# Patient Record
Sex: Female | Born: 1990 | Race: Black or African American | Hispanic: No | Marital: Single | Smoking: Current every day smoker
Health system: Southern US, Community
[De-identification: ages and names within clinical notes are randomized; demographics above are authoritative.]

## PROBLEM LIST (undated history)

## (undated) DIAGNOSIS — J45909 Unspecified asthma, uncomplicated: Secondary | ICD-10-CM

## (undated) HISTORY — PX: CHOLECYSTECTOMY: SHX55

## (undated) HISTORY — DX: Unspecified asthma, uncomplicated: J45.909

---
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
## 2017-03-14 HISTORY — PX: WISDOM TOOTH EXTRACTION: SHX21

## 2017-03-14 HISTORY — PX: CHOLECYSTECTOMY: SHX55

## 2017-10-30 ENCOUNTER — Other Ambulatory Visit (INDEPENDENT_AMBULATORY_CARE_PROVIDER_SITE_OTHER): Payer: Self-pay | Admitting: *Deleted

## 2017-10-30 DIAGNOSIS — K8050 Calculus of bile duct without cholangitis or cholecystitis without obstruction: Secondary | ICD-10-CM | POA: Insufficient documentation

## 2017-10-31 ENCOUNTER — Encounter (HOSPITAL_COMMUNITY)
Admission: RE | Admit: 2017-10-31 | Discharge: 2017-10-31 | Disposition: A | Discharge status: Home / Self Care | Payer: Self-pay | Source: Ambulatory Visit | Attending: Internal Medicine | Admitting: Internal Medicine

## 2017-10-31 ENCOUNTER — Other Ambulatory Visit: Payer: Self-pay

## 2017-10-31 ENCOUNTER — Encounter (HOSPITAL_COMMUNITY): Payer: Self-pay

## 2017-10-31 DIAGNOSIS — Z01818 Encounter for other preprocedural examination: Secondary | ICD-10-CM | POA: Insufficient documentation

## 2017-10-31 DIAGNOSIS — K8050 Calculus of bile duct without cholangitis or cholecystitis without obstruction: Secondary | ICD-10-CM | POA: Insufficient documentation

## 2017-10-31 LAB — PREGNANCY, URINE: PREG TEST UR: NEGATIVE

## 2017-10-31 NOTE — Patient Instructions (Addendum)
Erica PrinceJosephine L Watson  10/31/2017     @PREFPERIOPPHARMACY @   Your procedure is scheduled on 11/02/2017.  [facility] to [facility] at [facility]:30 A.M.  Call this number if you have problems the morning of surgery:  [phone]   Remember:  Do not eat or drink after midnight.    Take these medicines the morning of surgery with A SIP OF WATER : none    Do not wear jewelry, make-up or nail polish.  Do not wear lotions, powders, or perfumes, or deodorant.  Do not shave 48 hours prior to surgery.  Men may shave face and neck.  Do not bring valuables to the hospital.  [facility] not [facility] any belongings or valuables.  Contacts, dentures or bridgework may not be worn into surgery.  Leave your suitcase in the car.  After surgery it may be brought to your room.  For patients admitted to the hospital, discharge time will be determined by your treatment team.  Patients discharged the day of surgery will not be allowed to drive home.   Name and phone number of your driver:   family Special instructions:  n/a  Please read over the following fact sheets that you were given. Care and Recovery After Surgery     Endoscopic Retrograde Cholangiopancreatogram Endoscopic retrograde cholangiopancreatogram (ERCP) is a procedure that may be used to diagnose or treat problems with the pancreas, bile ducts, liver, and gallbladder. For this procedure, a thin, lighted tube (endoscope) is passed through the mouth, the throat, and down into the areas being checked. The endoscope has a camera that allows the areas to be viewed. Dye is injected and then X-rays are taken to further study the areas. During ERCP, other procedures may also be done to help diagnose or treat problems that are found. For example, stones can be removed, or a tissue sample can be taken out for testing (biopsy). Tell a health care provider about:  Any allergies you have.  All medicines you are taking, including vitamins, herbs,  eye drops, creams, and over-the-counter medicines.  Any problems you or family members have had with anesthetic medicines.  Any blood disorders you have.  Any surgeries you have had.  Any medical conditions you have.  Whether you are pregnant or may be pregnant. What are the risks? Generally, this is a safe procedure. However, problems may occur, including:  Pancreatitis.  Infection.  Bleeding.  Allergic reactions to medicines or dyes.  Accidental punctures in the bowel wall, pancreas, or gallbladder.  Damage to other structures or organs.  What happens before the procedure? Staying hydrated Follow instructions from your health care provider about hydration, which may include:  Up to 2 hours before the procedure - you may continue to drink clear liquids, such as water, clear fruit juice, black coffee, and plain tea.  Eating and drinking restrictions Follow instructions from your health care provider about eating and drinking, which may include:  8 hours before the procedure - stop eating heavy meals or foods such as meat, fried foods, or fatty foods.  6 hours before the procedure - stop eating light meals or foods, such as toast or cereal.  6 hours before the procedure - stop drinking milk or drinks that contain milk.  2 hours before the procedure - stop drinking clear liquids.  General instructions  Ask your health care provider about: ? Changing or stopping your regular medicines. This is especially important if you are taking diabetes medicines or blood  thinners. ? Taking medicines such as aspirin and ibuprofen. These medicines can thin your blood. Do not take these medicines before your procedure if your health care provider instructs you not to.  Plan to have someone take you home from the hospital or clinic.  If you will be going home right after the procedure, plan to have someone with you for 24 hours. What happens during the procedure?  To lower your  risk of infection, your health care team will wash or sanitize their hands.  An IV tube will be inserted into one of your veins.  You will be given one or more of the following: ? A medicine to help you relax (sedative). ? A medicine to numb the throat area (local anesthetic) and prevent gagging. Your throat may be sprayed with this medicine, or you may gargle the medicine. ? A medicine to make you fall asleep (general anesthetic). ? A medicine to lower your risk of infection (antibiotic), inflammation (anti-inflammatory), or both.  You will lie on your left side.  The endoscope will be inserted through your mouth, down the back of the throat, and into the first part of the small intestine (duodenum).  Then a small, plastic tube (cannula) will be passed through the endoscope and directed into the bile duct or pancreatic duct.  Dye will be injected through the cannula to make structures easier to see on an X-ray.  X-rays will be taken to study the biliary and pancreatic passageways. You may be positioned on your abdomen or your back during the X-rays.  A small sample of tissue (biopsy) may be removed for examination, or other procedures may be done to fix problems that are found. The procedure may vary among health care providers and hospitals. What happens after the procedure?  Your blood pressure, heart rate, breathing rate, and blood oxygen level will be monitored until the medicines you were given have worn off.  Your throat may feel slightly sore.  You will not be allowed to eat or drink until numbness subsides.  Do not drive for 24 hours if you were given a sedative. Summary  Endoscopic retrograde cholangiopancreatogram is a procedure that may be used to diagnose or treat problems with the pancreas, bile ducts, liver, and gallbladder.  During ERCP, other procedures may also be done to help diagnose or treat problems that are found. For example, stones can be removed, or a  tissue sample can be taken out for testing (biopsy).  Generally, this is a safe procedure. However, problems may occur, including infection, bleeding, pancreatitis, accidental damage to other structures or organs, and allergic reactions to medicines or dyes.  The procedure may vary among health care providers and hospitals. This information is not intended to replace advice given to you by your health care provider. Make sure you discuss any questions you have with your health care provider. Document Released: 11/23/2000 Document Revised: 01/25/2016 Document Reviewed: 01/25/2016 Elsevier Interactive Patient Education  2017 [employer]

## 2017-11-02 ENCOUNTER — Ambulatory Visit (HOSPITAL_COMMUNITY): Payer: Self-pay | Admitting: Anesthesiology

## 2017-11-02 ENCOUNTER — Encounter (HOSPITAL_COMMUNITY): Payer: Self-pay | Admitting: *Deleted

## 2017-11-02 ENCOUNTER — Ambulatory Visit (HOSPITAL_COMMUNITY): Payer: Self-pay

## 2017-11-02 ENCOUNTER — Encounter (HOSPITAL_COMMUNITY)
Admission: RE | Disposition: A | Discharge status: Home / Self Care | Payer: Self-pay | Source: Ambulatory Visit | Attending: Internal Medicine

## 2017-11-02 ENCOUNTER — Ambulatory Visit (HOSPITAL_COMMUNITY)
Admission: RE | Admit: 2017-11-02 | Discharge: 2017-11-02 | Disposition: A | Discharge status: Home / Self Care | Payer: Self-pay | Source: Ambulatory Visit | Attending: Internal Medicine | Admitting: Internal Medicine

## 2017-11-02 DIAGNOSIS — K8050 Calculus of bile duct without cholangitis or cholecystitis without obstruction: Secondary | ICD-10-CM | POA: Insufficient documentation

## 2017-11-02 DIAGNOSIS — Z882 Allergy status to sulfonamides status: Secondary | ICD-10-CM | POA: Insufficient documentation

## 2017-11-02 DIAGNOSIS — K915 Postcholecystectomy syndrome: Secondary | ICD-10-CM

## 2017-11-02 DIAGNOSIS — Z9049 Acquired absence of other specified parts of digestive tract: Secondary | ICD-10-CM | POA: Insufficient documentation

## 2017-11-02 DIAGNOSIS — F17210 Nicotine dependence, cigarettes, uncomplicated: Secondary | ICD-10-CM | POA: Insufficient documentation

## 2017-11-02 DIAGNOSIS — K8051 Calculus of bile duct without cholangitis or cholecystitis with obstruction: Secondary | ICD-10-CM

## 2017-11-02 HISTORY — PX: ERCP: SHX5425

## 2017-11-02 HISTORY — PX: SPHINCTEROTOMY: SHX5544

## 2017-11-02 HISTORY — PX: PANCREATIC STENT PLACEMENT: SHX5539

## 2017-11-02 SURGERY — ERCP, WITH INTERVENTION IF INDICATED
Anesthesia: General

## 2017-11-02 MED ORDER — ONDANSETRON HCL 4 MG/2ML IJ SOLN
INTRAMUSCULAR | Status: DC | PRN
  Administered 2017-11-02: 4 mg via INTRAVENOUS

## 2017-11-02 MED ORDER — HYDROCODONE-ACETAMINOPHEN 7.5-325 MG PO TABS: 1.0000 | ORAL_TABLET | Freq: Once | ORAL | Status: DC | PRN

## 2017-11-02 MED ORDER — INDOMETHACIN 50 MG RE SUPP
50.0000 mg | Freq: Once | RECTAL | Status: AC
  Administered 2017-11-02: 50 mg via RECTAL
  Filled 2017-11-02: qty 1

## 2017-11-02 MED ORDER — IOPAMIDOL (ISOVUE-300) INJECTION 61%
INTRAVENOUS | Status: AC
  Filled 2017-11-02: qty 100

## 2017-11-02 MED ORDER — CEFAZOLIN SODIUM-DEXTROSE 2-4 GM/100ML-% IV SOLN
2.0000 g | INTRAVENOUS | Status: AC
  Administered 2017-11-02: 2 g via INTRAVENOUS

## 2017-11-02 MED ORDER — PROMETHAZINE HCL 25 MG/ML IJ SOLN: 6.2500 mg | INTRAMUSCULAR | Status: DC | PRN

## 2017-11-02 MED ORDER — IOPAMIDOL (ISOVUE-M 300) INJECTION 61%
INTRAMUSCULAR | Status: DC | PRN
  Administered 2017-11-02: 25 mL

## 2017-11-02 MED ORDER — CEFAZOLIN SODIUM-DEXTROSE 2-4 GM/100ML-% IV SOLN
INTRAVENOUS | Status: AC
  Filled 2017-11-02: qty 100

## 2017-11-02 MED ORDER — FENTANYL CITRATE (PF) 100 MCG/2ML IJ SOLN
INTRAMUSCULAR | Status: AC
  Filled 2017-11-02: qty 2

## 2017-11-02 MED ORDER — LIDOCAINE HCL (CARDIAC) PF 50 MG/5ML IV SOSY
PREFILLED_SYRINGE | INTRAVENOUS | Status: DC | PRN
  Administered 2017-11-02: 30 mg via INTRAVENOUS

## 2017-11-02 MED ORDER — ONDANSETRON HCL 4 MG/2ML IJ SOLN
INTRAMUSCULAR | Status: AC
  Filled 2017-11-02: qty 2

## 2017-11-02 MED ORDER — STERILE WATER FOR IRRIGATION IR SOLN
Status: DC | PRN
  Administered 2017-11-02: 1000 mL

## 2017-11-02 MED ORDER — GLUCAGON HCL RDNA (DIAGNOSTIC) 1 MG IJ SOLR
INTRAMUSCULAR | Status: AC
  Filled 2017-11-02: qty 2

## 2017-11-02 MED ORDER — MEPERIDINE HCL 100 MG/ML IJ SOLN: 6.2500 mg | INTRAMUSCULAR | Status: DC | PRN

## 2017-11-02 MED ORDER — CHLORHEXIDINE GLUCONATE CLOTH 2 % EX PADS: 6.0000 | MEDICATED_PAD | Freq: Once | CUTANEOUS | Status: DC

## 2017-11-02 MED ORDER — EPHEDRINE SULFATE 50 MG/ML IJ SOLN
INTRAMUSCULAR | Status: AC
  Filled 2017-11-02: qty 1

## 2017-11-02 MED ORDER — FENTANYL CITRATE (PF) 100 MCG/2ML IJ SOLN
INTRAMUSCULAR | Status: DC | PRN
  Administered 2017-11-02 (×2): 25 ug via INTRAVENOUS
  Administered 2017-11-02 (×2): 50 ug via INTRAVENOUS

## 2017-11-02 MED ORDER — GLUCAGON HCL RDNA (DIAGNOSTIC) 1 MG IJ SOLR
INTRAMUSCULAR | Status: DC | PRN
  Administered 2017-11-02 (×2): 0.25 mg via INTRAVENOUS

## 2017-11-02 MED ORDER — LACTATED RINGERS IV SOLN: INTRAVENOUS | Status: DC

## 2017-11-02 MED ORDER — ROCURONIUM 10MG/ML (10ML) SYRINGE FOR MEDFUSION PUMP - OPTIME
INTRAVENOUS | Status: DC | PRN
  Administered 2017-11-02: 8 mg via INTRAVENOUS

## 2017-11-02 MED ORDER — HYDROMORPHONE HCL 1 MG/ML IJ SOLN
0.2500 mg | INTRAMUSCULAR | Status: DC | PRN
  Administered 2017-11-02: 0.5 mg via INTRAVENOUS
  Filled 2017-11-02: qty 0.5

## 2017-11-02 MED ORDER — LACTATED RINGERS IV SOLN
INTRAVENOUS | Status: DC
  Administered 2017-11-02: 1000 mL via INTRAVENOUS

## 2017-11-02 MED ORDER — LIDOCAINE HCL (PF) 1 % IJ SOLN
INTRAMUSCULAR | Status: AC
  Filled 2017-11-02: qty 10

## 2017-11-02 MED ORDER — SUCCINYLCHOLINE 20MG/ML (10ML) SYRINGE FOR MEDFUSION PUMP - OPTIME
INTRAMUSCULAR | Status: DC | PRN
  Administered 2017-11-02: 120 mg via INTRAVENOUS

## 2017-11-02 MED ORDER — ROCURONIUM BROMIDE 50 MG/5ML IV SOLN
INTRAVENOUS | Status: AC
  Filled 2017-11-02: qty 1

## 2017-11-02 MED ORDER — MIDAZOLAM HCL 5 MG/5ML IJ SOLN
INTRAMUSCULAR | Status: DC | PRN
  Administered 2017-11-02: 2 mg via INTRAVENOUS

## 2017-11-02 MED ORDER — MIDAZOLAM HCL 2 MG/2ML IJ SOLN
INTRAMUSCULAR | Status: AC
  Filled 2017-11-02: qty 2

## 2017-11-02 MED ORDER — PROPOFOL 10 MG/ML IV BOLUS
INTRAVENOUS | Status: DC | PRN
  Administered 2017-11-02: 190 mg via INTRAVENOUS

## 2017-11-02 MED ORDER — SUCCINYLCHOLINE CHLORIDE 20 MG/ML IJ SOLN
INTRAMUSCULAR | Status: AC
  Filled 2017-11-02: qty 1

## 2017-11-02 NOTE — Anesthesia Preprocedure Evaluation (Signed)
Anesthesia Evaluation  Patient identified by MRN, date of birth, ID band Patient awake    Reviewed: Allergy & Precautions, H&P , NPO status , Patient's Chart, lab work & pertinent test results, reviewed documented beta blocker date and time   Airway Mallampati: II  TM Distance: >3 FB Neck ROM: full    Dental no notable dental hx. (+) Teeth Intact, Dental Advidsory Given   Pulmonary neg pulmonary ROS, Current Smoker,    Pulmonary exam normal breath sounds clear to auscultation       Cardiovascular Exercise Tolerance: Good negative cardio ROS   Rhythm:regular Rate:Normal     Neuro/Psych negative neurological ROS  negative psych ROS   GI/Hepatic negative GI ROS, Neg liver ROS,   Endo/Other  negative endocrine ROS  Renal/GU negative Renal ROS  negative genitourinary   Musculoskeletal   Abdominal   Peds  Hematology negative hematology ROS (+)   Anesthesia Other Findings Bile duct stone for ERCP Lap Chole 6 days ago.  No anesthesia issues Obesity  Reproductive/Obstetrics negative OB ROS                             Anesthesia Physical Anesthesia Plan  ASA: II  Anesthesia Plan: General   Post-op Pain Management:    Induction:   PONV Risk Score and Plan:   Airway Management Planned:   Additional Equipment:   Intra-op Plan:   Post-operative Plan:   Informed Consent: I have reviewed the patients History and Physical, chart, labs and discussed the procedure including the risks, benefits and alternatives for the proposed anesthesia with the patient or authorized representative who has indicated his/her understanding and acceptance.   Dental Advisory Given  Plan Discussed with: CRNA and Anesthesiologist  Anesthesia Plan Comments:         Anesthesia Quick Evaluation

## 2017-11-02 NOTE — H&P (Signed)
Erica Watson is an 27 y.o. Watson.   Chief Complaint: Patient is here for therapeutic ERCP. HPI: Patient is 27 year old Afro-American Watson who underwent laparoscopic cholecystectomy by Dr. Marcha Soldersathey on 10/29/2017 for cholelithiasis.  She had intraoperative cholangiogram which revealed choledocholithiasis.  Patient felt better after gallbladder surgery and she was discharged.  She is now returning for ERCP with sphincterotomy and stone extraction.  Patient states she had intermittent abdominal pain with nausea and vomiting for over a month.  She lost 17 pounds during this..  Now her appetite is back to normal.  She has some pain around periumbilical laparoscopy port.  She has not had any more nausea vomiting.  She denies melena or rectal bleeding.  She does not take any medications or regular basis. She was given Norco and Senokot for short-term use following cholecystectomy. Family history significant for cholelithiasis.  Her mother had cholecystectomy at age 27.   History reviewed. No pertinent past medical history.  Past Surgical History:  Procedure Laterality Date  . CHOLECYSTECTOMY      History reviewed. No pertinent family history. Social History:  reports that she has been smoking cigarettes. She has a 4.00 pack-year smoking history. She has never used smokeless tobacco. She reports that she does not drink alcohol or use drugs.  Allergies:  Allergies  Allergen Reactions  . Sulfa Antibiotics Swelling    Medications Prior to Admission  Medication Sig Dispense Refill  . HYDROcodone-acetaminophen (NORCO/VICODIN) 5-325 MG tablet Take 1 tablet by mouth every 6 (six) hours as needed for moderate pain.    Marland Kitchen. senna-docusate (SENOKOT-S) 8.6-50 MG tablet Take 1 tablet by mouth daily.      Results for orders placed or performed during the hospital encounter of 10/31/17 (from the past 48 hour(s))  Pregnancy, urine     Status: None   Collection Time: 10/31/17  3:34 PM  Result Value Ref  Range   Preg Test, Ur NEGATIVE NEGATIVE    Comment:        THE SENSITIVITY OF THIS METHODOLOGY IS >20 mIU/mL. Performed at [facility], [address] [place], [place] [address]    No results found.  ROS  Blood pressure 109/67, pulse 78, temperature 98.7 F (37.1 C), temperature source Oral, resp. rate 20, last menstrual period 10/27/2017, SpO2 98 %. Physical Exam  Constitutional: She appears well-developed and well-nourished.  HENT:  Mouth/Throat: Oropharynx is clear and moist.  Eyes: Conjunctivae are normal. No scleral icterus.  Neck: No thyromegaly present.  Cardiovascular: Normal rate, regular rhythm and normal heart sounds.  No murmur heard. Respiratory: Effort normal and breath sounds normal.  GI:  Abdomen is full with supraumbilical laparoscopy wound along with four more laparoscopy ports.  Abdomen is soft and nontender with organomegaly or masses.  Musculoskeletal: She exhibits no edema.  Lymphadenopathy:    She has no cervical adenopathy.  Neurological: She is alert.  Skin: Skin is warm and dry.    [facility] reviewed.  Normal platelet count.  Normal LFTs.  Assessment/Plan Choledocholithiasis discovered at the time of laparoscopic cholecystectomy last week. Patient's LFTs are normal. Patient will undergo ERCP with sphincterotomy and stone extraction. I have reviewed the procedure and risks with the patient and her mother and they are both agreeable. Patient will be going home after the procedure.  Lionel DecemberNajeeb Jaiyla Granados, MD 11/02/2017, 8:55 AM

## 2017-11-02 NOTE — Anesthesia Postprocedure Evaluation (Signed)
Anesthesia Post Note  Patient: Erica PrinceJosephine L Watson  Procedure(s) Performed: ENDOSCOPIC RETROGRADE CHOLANGIOPANCREATOGRAPHY (ERCP) (N/A ) SPHINCTEROTOMY WITH REMOVAL OF DEBRIS (N/A ) PANCREATIC STENT PLACEMENT  Patient location during evaluation: Short Stay Anesthesia Type: General Level of consciousness: awake and alert and patient cooperative Pain management: satisfactory to patient Vital Signs Assessment: post-procedure vital signs reviewed and stable Respiratory status: spontaneous breathing Cardiovascular status: stable Postop Assessment: no apparent nausea or vomiting Anesthetic complications: no     Last Vitals:  Vitals:   11/02/17 1034 11/02/17 1113  BP:  105/75  Pulse:  89  Resp:  18  Temp: 36.9 C 36.6 C  SpO2:  97%    Last Pain:  Vitals:   11/02/17 1113  TempSrc: Oral  PainSc: 0-No pain                 Makaya Juneau

## 2017-11-02 NOTE — Discharge Instructions (Signed)
No aspirin or NSAIDs for 3 days. Clear liquids today and usual diet starting tomorrow morning. No driving for 24 hours. Will arrange for plain abdominal film in 2 weeks unless you passed pancreatic stent. Office will call.   Endoscopic Retrograde Cholangiopancreatogram, Care After This sheet gives you information about how to care for yourself after your procedure. Your health care provider may also give you more specific instructions. If you have problems or questions, contact your health care provider. What can I expect after the procedure? After the procedure, it is common to have:  Soreness in your throat.  Nausea.  Bloating.  Dizziness.  Tiredness (fatigue).  Follow these instructions at home:  Take over-the-counter and prescription medicines only as told by your health care provider.  Do not drive for 24 hours if you were given a medicine to help you relax (sedative) during your procedure. Have someone stay with you for 24 hours after the procedure.  Return to your normal activities as told by your health care provider. Ask your health care provider what activities are safe for you.  Return to eating what you normally do as soon as you feel well enough or as told by your health care provider.  Keep all follow-up visits as told by your health care provider. This is important. Contact a health care provider if:  You have pain in your abdomen that does not get better with medicine.  You develop signs of infection, such as: ? Chills. ? Feeling unwell. Get help right away if:  You have difficulty swallowing.  You have worsening pain in your throat, chest, or abdomen.  You vomit bright red blood or a substance that looks like coffee grounds.  You have bloody or very black stools.  You have a fever.  You have a sudden increase in swelling (bloating) in your abdomen. Summary  After the procedure, it is common to feel tired and to have some discomfort in your  throat.  Contact your health care provider if you have signs of infection--such as chills or feeling unwell--or if you have pain that does not improve with medicine.  Get help right away if you have trouble swallowing, worsening pain, bloody or black vomit, bloody or black stools, a fever, or increased swelling in your abdomen.  Keep all follow-up visits as told by your health care provider. This is important. This information is not intended to replace advice given to you by your health care provider. Make sure you discuss any questions you have with your health care provider. Document Released: 12/19/2012 Document Revised: 01/18/2016 Document Reviewed: 01/18/2016 Elsevier Interactive Patient Education  2017 [employer] Inc[employer]   PATIENT INSTRUCTIONS POST-ANESTHESIA  IMMEDIATELY FOLLOWING SURGERY:  Do not drive or operate machinery for the first twenty four hours after surgery.  Do not make any important decisions for twenty four hours after surgery or while taking narcotic pain medications or sedatives.  If you develop intractable nausea and vomiting or a severe headache please notify your doctor immediately.  FOLLOW-UP:  Please make an appointment with your surgeon as instructed. You do not need to follow up with anesthesia unless specifically instructed to do so.  WOUND CARE INSTRUCTIONS (if applicable):  Keep a dry clean dressing on the anesthesia/puncture wound site if there is drainage.  Once the wound has quit draining you may leave it open to air.  Generally you should leave the bandage intact for twenty four hours unless there is drainage.  If the epidural site drains for  more than 36-48 hours please call the anesthesia department.  QUESTIONS?:  Please feel free to call your physician or the hospital operator if you have any questions, and they will be happy to assist you.

## 2017-11-02 NOTE — Anesthesia Procedure Notes (Signed)
Procedure Name: Intubation Date/Time: 11/02/2017 9:22 AM Performed by: Ollen Bowl, CRNA Pre-anesthesia Checklist: Patient identified, Patient being monitored, Timeout performed, Emergency Drugs available and Suction available Patient Re-evaluated:Patient Re-evaluated prior to induction Oxygen Delivery Method: Circle system utilized Preoxygenation: Pre-oxygenation with 100% oxygen Induction Type: IV induction Ventilation: Mask ventilation without difficulty Laryngoscope Size: Mac and 3 Grade View: Grade I Tube type: Oral Tube size: 7.0 mm Number of attempts: 1 Airway Equipment and Method: Stylet Placement Confirmation: ETT inserted through vocal cords under direct vision,  positive ETCO2 and breath sounds checked- equal and bilateral Secured at: 22 cm Tube secured with: Tape Dental Injury: Teeth and Oropharynx as per pre-operative assessment

## 2017-11-02 NOTE — Op Note (Signed)
NAME: Joella PrinceDOGGETT, Kimyata L. MEDICAL RECORD ZO:[id number] ACCOUNT [id number] DATE OF BIRTH:Jan 07, 1991 FACILITY: [facility] LOCATION: [facility] PHYSICIAN:NAJEEB Cline CrockU. REHMAN, MD  OPERATIVE REPORT  DATE OF PROCEDURE:  11/02/2017  PROCEDURE:  ERCP with sphincterotomy and pancreatic stenting.  REFERRING PHYSICIAN:  Quentin AngstLamont Cathey, MD  INDICATIONS:  The patient is a 27 year old African-American female who underwent laparoscopic cholecystectomy last week, and intraoperative cholangiogram revealed common duct stone.  She is therefore undergoing therapeutic ERCP.  Procedure risks were reviewed with the patient and her mother.  Informed consent was obtained.  All questions were answered.  DESCRIPTION OF PROCEDURE:  The procedure was performed in the OR.   The patient was placed under general endotracheal anesthesia.  She was turned into semi-prone position.  Time out was carried out.  Olympus video duodenoscope was passed via the oropharynx into the esophagus and stomach without any difficulty.  A limited view of esophageal and gastric mucosa was normal.  Scope was advanced across the pylorus to the bulb and descending duodenum.  Ampulla of Vater was identified.  It was normal.  Cannulation of the bile duct was attempted with RX 47 Autotome with O35 Hydra Jagwire.  Pancreatic duct was initially cannulated with a guidewire.  Contrast was not injected.  Selective cannulation of CBD was difficult.  Therefore, a guidewire was left  in the pancreatic duct to facilitate selective cannulation of bile duct, which was then easily accomplished.  A dilute contrast was injected in the biliary system outlined.  Intrahepatic biliary radicles were normal.  CHD and CBD were also normal without  any filling defects.  I proceeded with a biliary sphincterotomy.  Balloon stone extractor was passed through the bile duct with removal of some debris, but no stone.  A 4-French 5 cm long single pigtail pancreatic stent was  advanced over the guidewire into the pancreatic duct.  The guidewire was removed.  Stent was in good position.  Endoscope was withdrawn.  The patient tolerated the procedure well.  She was extubated and taken to PACU for recovery.  COMPLICATIONS:  None.  Bleeding; None  FINAL DIAGNOSIS:  Normal ampulla of Vater.  Normal CBD CHD without filling defects.  Biliary sphincterotomy performed with removal of debris, but no stone was identified.  A 4 French x 5 cm single pigtail pancreatic stent in place for prophylaxis.  RECOMMENDATIONS:    Indomethacin suppository 50 mg per rectum once, 50 mg per rectum while patient is in PACU.  No aspirin or anticoagulants for 3 days.  Clear liquids today.  The patient will resume her usual medications as before.  She will advance diet to regular diet starting tomorrow.  Unless the patient passes pancreatic stent spontaneously, she will return for plain abdominal film in 2 weeks.  LN/NUANCE  D:11/02/2017 T:11/02/2017 JOB:[id number]

## 2017-11-02 NOTE — Progress Notes (Signed)
Brief ERCP note.  Normal ampulla of Vater. PD cannulated with guidewire and 4 French 5 cm single pigtail stent placed for prophylaxis. Normal intrahepatic biliary system. Normal sized VHD and CBD without filling defects. Biliary sphincterotomy performed and debris removed.

## 2017-11-02 NOTE — Transfer of Care (Signed)
Immediate Anesthesia Transfer of Care Note  Patient: Erica PrinceJosephine L Watson  Procedure(s) Performed: ENDOSCOPIC RETROGRADE CHOLANGIOPANCREATOGRAPHY (ERCP) (N/A ) REMOVAL OF STONES (N/A ) SPHINCTEROTOMY (N/A ) PANCREATIC STENT PLACEMENT  Patient Location: PACU  Anesthesia Type:General  Level of Consciousness: awake and alert   Airway & Oxygen Therapy: Patient Spontanous Breathing  Post-op Assessment: Report given to RN and Post -op Vital signs reviewed and stable  Post vital signs: Reviewed and stable  Last Vitals:  Vitals Value Taken Time  BP 114/59 11/02/2017 10:34 AM  Temp    Pulse 88 11/02/2017 10:38 AM  Resp 15 11/02/2017 10:38 AM  SpO2 99 % 11/02/2017 10:38 AM  Vitals shown include unvalidated device data.  Last Pain:  Vitals:   11/02/17 0749  TempSrc: Oral  PainSc: 2       Patients Stated Pain Goal: 6 (11/02/17 0749)  Complications: No apparent anesthesia complications

## 2017-11-06 ENCOUNTER — Encounter (HOSPITAL_COMMUNITY): Payer: Self-pay | Admitting: Internal Medicine

## 2019-03-04 IMAGING — RF DG ERCP WO/W SPHINCTEROTOMY
1 series · 9 of 9 positions shown · non-contrast
Comparison: Intraoperative cholangiogram 10/17/2017

CLINICAL DATA: Common bile duct stone.

EXAM:
ERCP
TECHNIQUE: Multiple spot images obtained with the fluoroscopic device and
submitted for interpretation post-procedure.
FLUOROSCOPY TIME:  Fluoroscopy Time:  3 minutes and 24 seconds

[Series 1: run · 4 acquisitions, 9 frames shown]
[im 1/4]
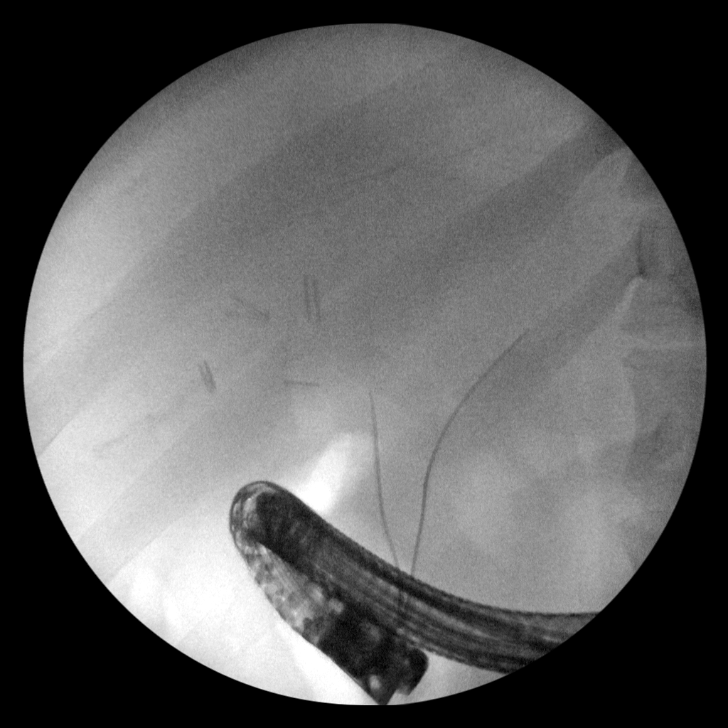
[im 2/4]
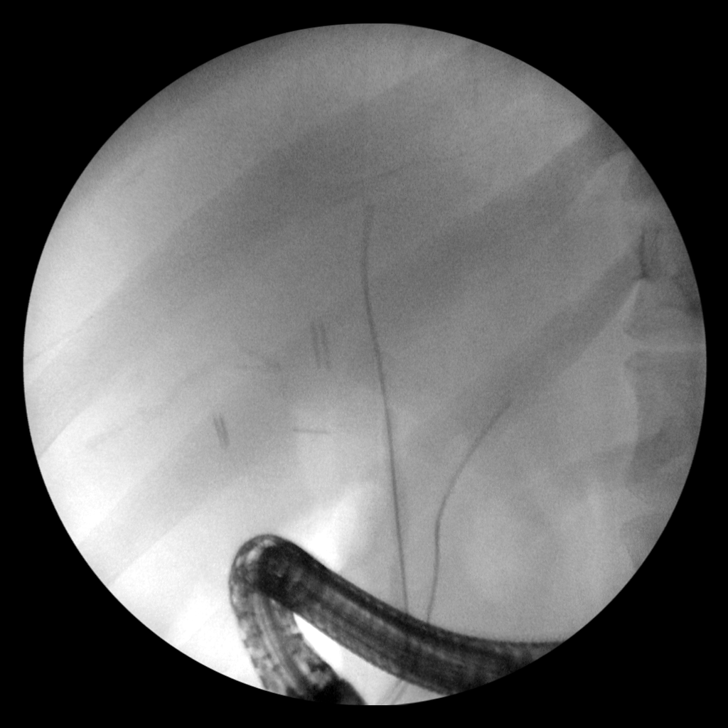
[im 2/4]
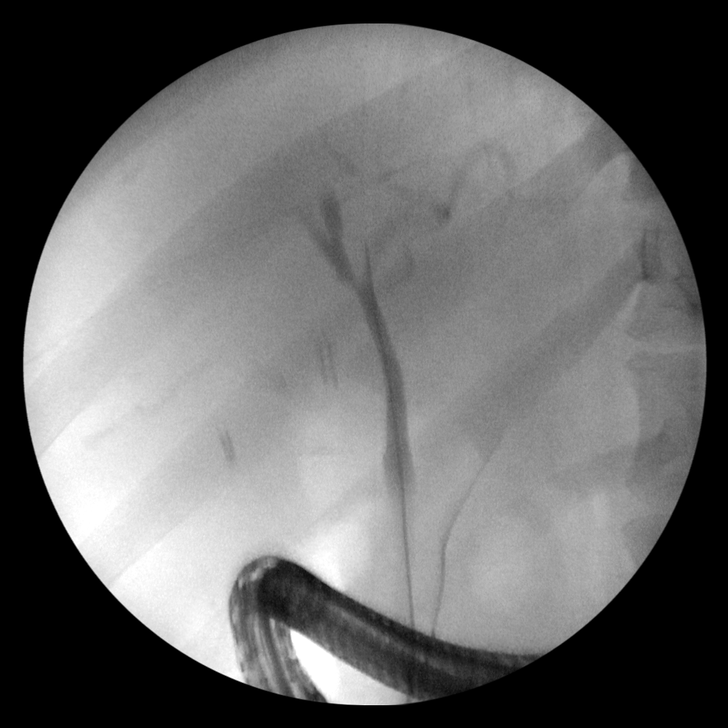
[im 2/4]
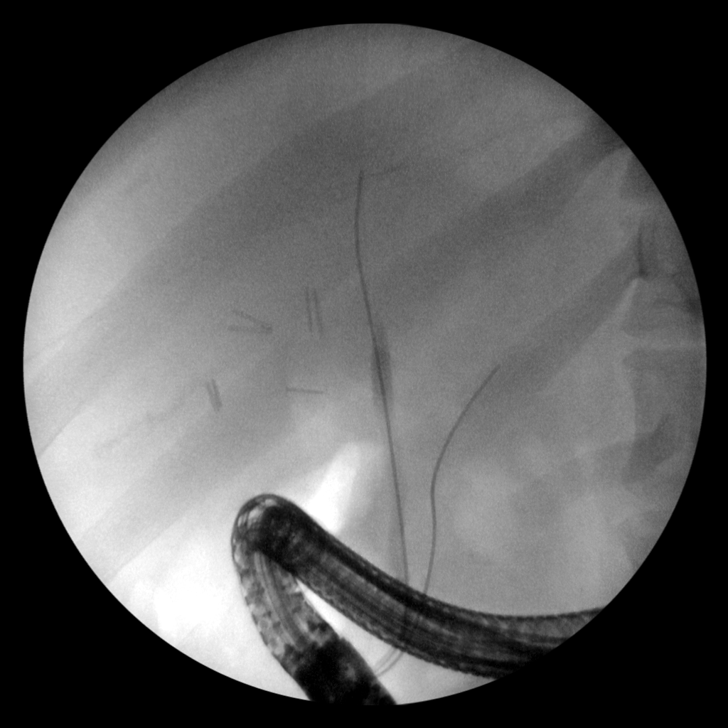
[im 2/4]
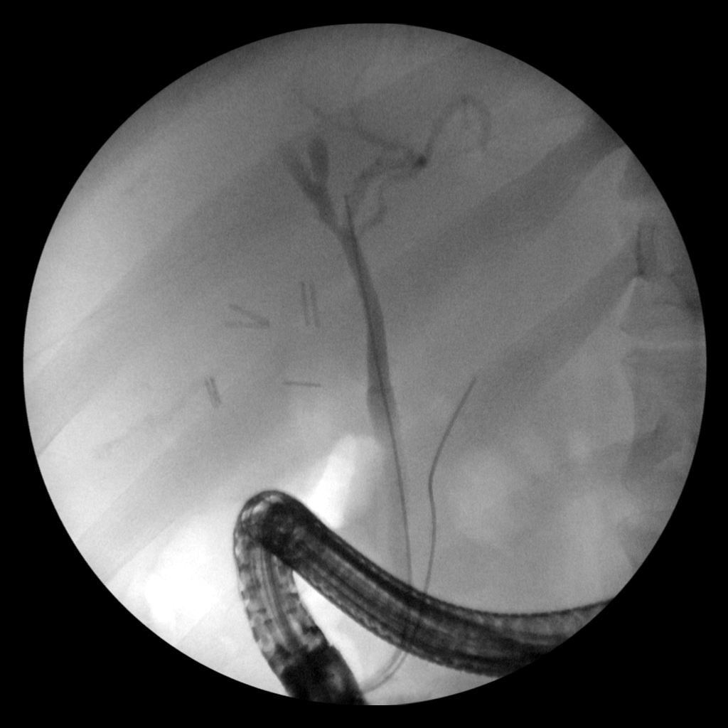
[im 3/4]
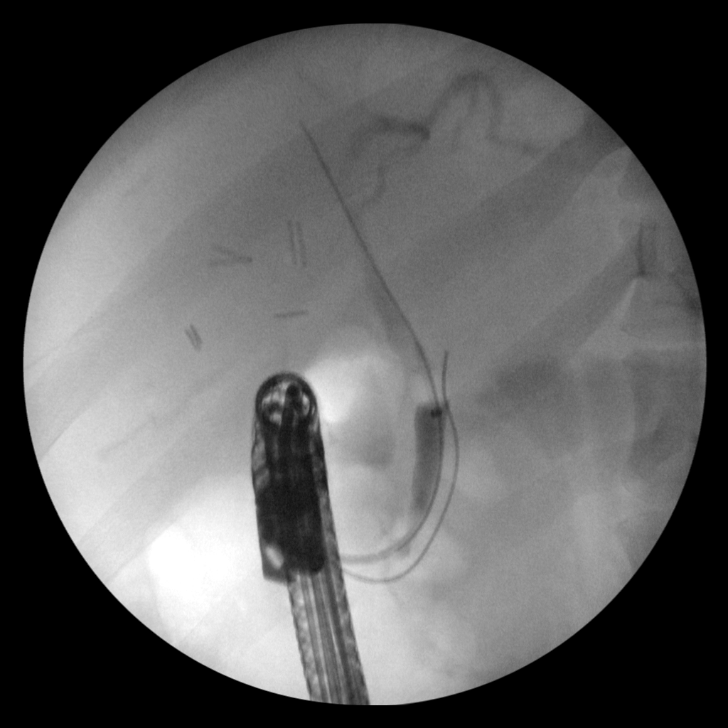
[im 3/4]
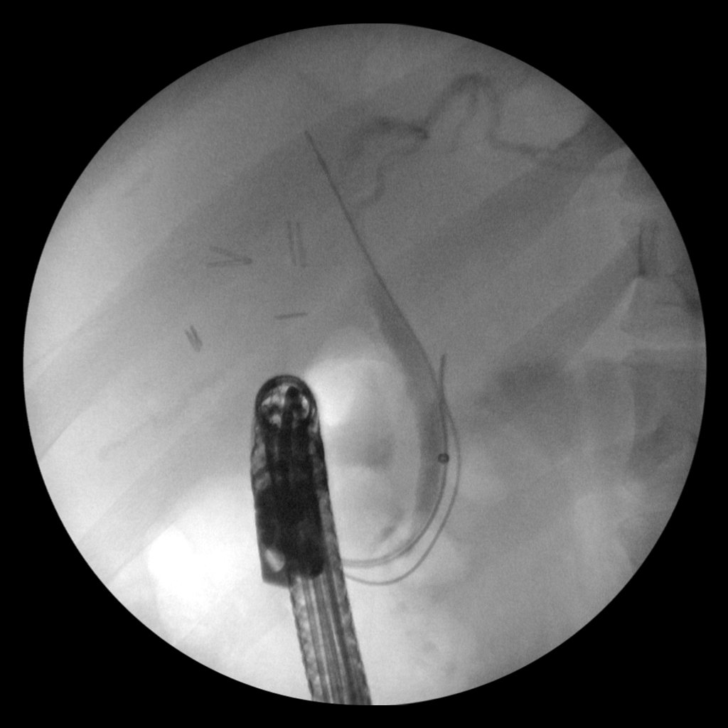
[im 3/4]
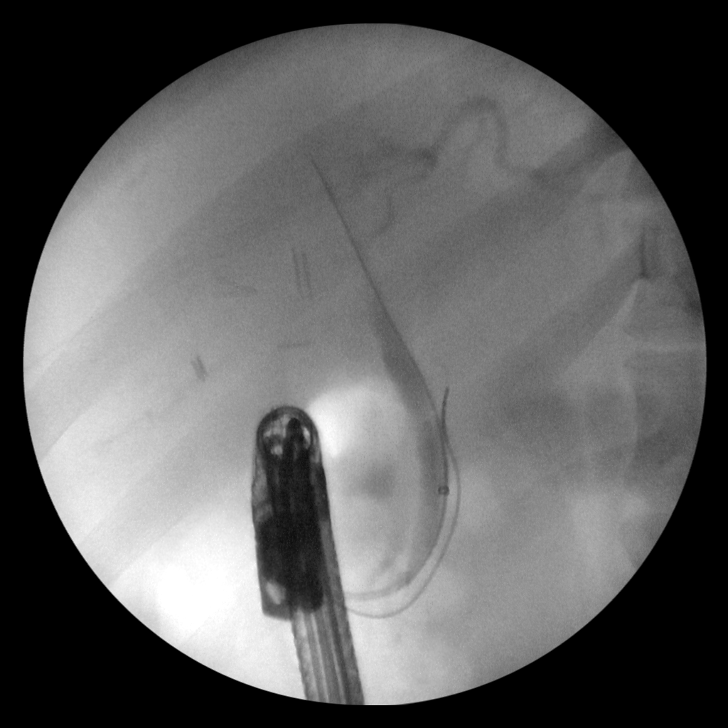
[im 4/4]
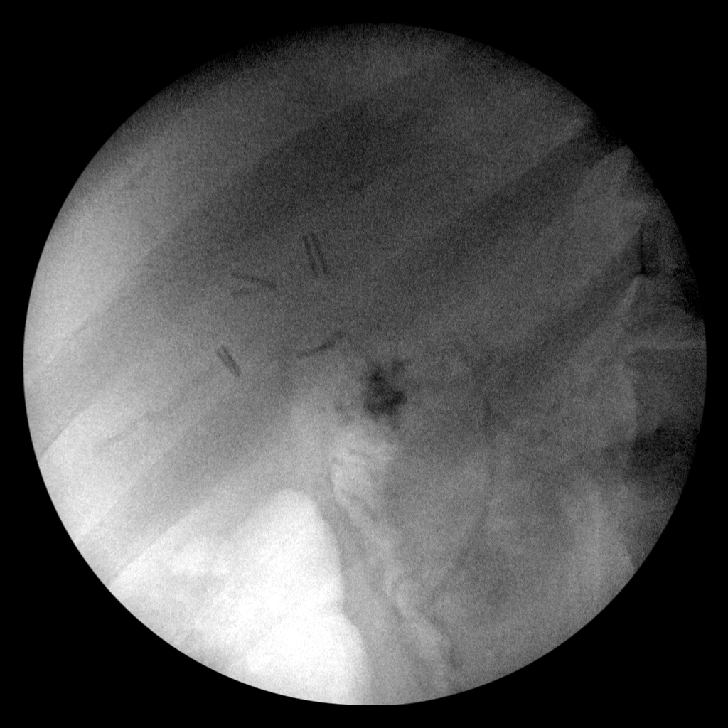

[9 of 9 positions shown; findings below may reference images not displayed]

FINDINGS: First image demonstrates wires extending into the region of the main
pancreatic duct and the common bile duct. Cholecystectomy clips are
present. [DATE] of images demonstrate opacification of the
biliary system. No significant biliary dilatation and no large
filling defects identified on this examination. A balloon sweep was
performed. There is a nonmetallic biliary stent on the final image.
IMPRESSION: Cannulation and opacification of the biliary system with a balloon
sweep. Placement of biliary stent.

These images were submitted for radiologic interpretation only.
Please see the procedural report for the amount of contrast and the
fluoroscopy time utilized.

## 2022-04-06 DIAGNOSIS — L0291 Cutaneous abscess, unspecified: Secondary | ICD-10-CM | POA: Diagnosis not present

## 2022-04-06 DIAGNOSIS — J4540 Moderate persistent asthma, uncomplicated: Secondary | ICD-10-CM | POA: Diagnosis not present

## 2022-04-06 DIAGNOSIS — Z6841 Body Mass Index (BMI) 40.0 and over, adult: Secondary | ICD-10-CM | POA: Diagnosis not present

## 2022-04-26 DIAGNOSIS — A09 Infectious gastroenteritis and colitis, unspecified: Secondary | ICD-10-CM | POA: Diagnosis not present

## 2022-04-26 DIAGNOSIS — Z6841 Body Mass Index (BMI) 40.0 and over, adult: Secondary | ICD-10-CM | POA: Diagnosis not present

## 2022-06-02 DIAGNOSIS — Z6841 Body Mass Index (BMI) 40.0 and over, adult: Secondary | ICD-10-CM | POA: Diagnosis not present

## 2022-06-02 DIAGNOSIS — L0291 Cutaneous abscess, unspecified: Secondary | ICD-10-CM | POA: Diagnosis not present

## 2022-06-17 DIAGNOSIS — K0889 Other specified disorders of teeth and supporting structures: Secondary | ICD-10-CM | POA: Diagnosis not present

## 2022-07-11 DIAGNOSIS — Z6841 Body Mass Index (BMI) 40.0 and over, adult: Secondary | ICD-10-CM | POA: Diagnosis not present

## 2022-07-11 DIAGNOSIS — J4540 Moderate persistent asthma, uncomplicated: Secondary | ICD-10-CM | POA: Diagnosis not present

## 2023-04-19 ENCOUNTER — Ambulatory Visit: Payer: Medicaid Other | Admitting: Adult Health

## 2023-05-30 ENCOUNTER — Ambulatory Visit: Payer: Medicaid Other | Admitting: Adult Health

## 2023-05-30 ENCOUNTER — Other Ambulatory Visit (HOSPITAL_COMMUNITY)
Admission: RE | Admit: 2023-05-30 | Discharge: 2023-05-30 | Disposition: A | Discharge status: Home / Self Care | Source: Ambulatory Visit | Attending: Adult Health | Admitting: Adult Health

## 2023-05-30 ENCOUNTER — Encounter: Payer: Self-pay | Admitting: Adult Health

## 2023-05-30 VITALS — BP 151/101 | HR 96 | Ht 67.0 in | Wt 391.8 lb

## 2023-05-30 DIAGNOSIS — R030 Elevated blood-pressure reading, without diagnosis of hypertension: Secondary | ICD-10-CM

## 2023-05-30 DIAGNOSIS — F419 Anxiety disorder, unspecified: Secondary | ICD-10-CM

## 2023-05-30 DIAGNOSIS — Z0000 Encounter for general adult medical examination without abnormal findings: Secondary | ICD-10-CM

## 2023-05-30 DIAGNOSIS — F32A Depression, unspecified: Secondary | ICD-10-CM | POA: Diagnosis not present

## 2023-05-30 DIAGNOSIS — Z1331 Encounter for screening for depression: Secondary | ICD-10-CM

## 2023-05-30 MED ORDER — ESCITALOPRAM OXALATE 10 MG PO TABS: 10.0000 mg | ORAL_TABLET | Freq: Every day | ORAL | 2 refills | Status: AC

## 2023-05-30 NOTE — Progress Notes (Signed)
 Patient ID: Erica Watson, female   DOB: 10/11/1990, 33 y.o.   MRN: [id number] History of Present Illness: Erica Watson is a 33 year old black female,single, G0P0, in for a well woman gyn exam and pap.  PCP is Dr Olena Leatherwood    Current Medications, Allergies, Past Medical History, Past Surgical History, Family History and Social History were reviewed in [employer] electronic medical record.     Review of Systems: Patient denies any headaches, hearing loss, fatigue, blurred vision, shortness of breath, chest pain, abdominal pain, problems with bowel movements, urination, or intercourse(not active). No joint pain or mood swings.  Has allergies Period has been more brown    Physical Exam:BP (!) 151/101 (BP Location: Left Wrist, Patient Position: Sitting, Cuff Size: Large)   Pulse 96   Ht 5\' 7"  (1.702 m)   Wt (!) 391 lb 12.8 oz (177.7 kg)   LMP 05/25/2023 (Approximate)   BMI 61.36 kg/m   General:  Well developed, well nourished, no acute distress Skin:  Warm and dry Neck:  Midline trachea, normal thyroid, good ROM, no lymphadenopathy Lungs; Clear to auscultation bilaterally Breast:  No dominant palpable mass, retraction, or nipple discharge,breasts are large Cardiovascular: Regular rate and rhythm Abdomen:  Soft, non tender, no hepatosplenomegaly,obese Pelvic:  External genitalia is normal in appearance, no lesions.  The vagina is normal in appearance. Urethra has no lesions or masses. The cervix is smooth, pap with GC/CHL and HR HPV genotyping performed.  Uterus is felt to be normal size, shape, and contour.  No adnexal masses or tenderness noted.Bladder is non tender, no masses felt. Extremities/musculoskeletal:  No swelling or varicosities noted, no clubbing or cyanosis Psych:  No mood changes, alert and cooperative,seems happy AA is 2 Fall risk is low    05/30/2023   10:53 AM  Depression screen PHQ 2/9  Decreased Interest 1  Down, Depressed, Hopeless 0  PHQ - 2 Score 1   Altered sleeping 3  Tired, decreased energy 3  Change in appetite 3  Feeling bad or failure about yourself  0  Trouble concentrating 2  Moving slowly or fidgety/restless 0  Suicidal thoughts 0  PHQ-9 Score 12       05/30/2023   10:54 AM  GAD 7 : Generalized Anxiety Score  Nervous, Anxious, on Edge 3  Control/stop worrying 3  Worry too much - different things 3  Trouble relaxing 3  Restless 3  Easily annoyed or irritable 3  Afraid - awful might happen 3  Total GAD 7 Score 21    Upstream - 05/30/23 1105       Pregnancy Intention Screening   Does the patient want to become pregnant in the next year? No    Does the patient's partner want to become pregnant in the next year? No    Would the patient like to discuss contraceptive options today? No      Contraception Wrap Up   Current Method Abstinence    End Method Abstinence    Contraception Counseling Provided No            Examination chaperoned by Marylynn Pearson NP student    Impression and plan: 1. Routine general medical examination at a health care facility (Primary) Pap sent Pap in 3 years if normal Physical in 1 year  - Cytology - PAP( [place])  2. Anxiety and depression Has more anxiety than depression, will usually sleep, but is open to meds Will rx lexapro 10 mg 1 daily Meds ordered  this encounter  Medications   escitalopram (LEXAPRO) 10 MG tablet    Sig: Take 1 tablet (10 mg total) by mouth daily.    Dispense:  30 tablet    Refill:  2    Supervising Provider:   Duane Lope H [[id number]]   Follow up in 8 weeks for ROS   3. Elevated BP without diagnosis of hypertension Was normal at PCP Will keep BP

## 2023-05-30 NOTE — Progress Notes (Signed)
 Patient ID: Erica Watson, female   DOB: 1991/02/14, 33 y.o.   MRN: [id number]

## 2023-06-01 LAB — CYTOLOGY - PAP
Adequacy: ABSENT
Chlamydia: NEGATIVE
Comment: NEGATIVE
Comment: NEGATIVE
Comment: NORMAL
Diagnosis: NEGATIVE
High risk HPV: NEGATIVE
Neisseria Gonorrhea: NEGATIVE

## 2023-07-25 ENCOUNTER — Ambulatory Visit: Payer: Self-pay | Admitting: Adult Health
